# Patient Record
Sex: Male | Born: 1975 | Race: White | Hispanic: No | Marital: Married | Smoking: Never smoker
Health system: Southern US, Community
[De-identification: ages and names within clinical notes are randomized; demographics above are authoritative.]

## PROBLEM LIST (undated history)

## (undated) HISTORY — PX: KNEE SURGERY: SHX244

---

## 2012-01-06 ENCOUNTER — Ambulatory Visit: Payer: Self-pay | Admitting: Family Medicine

## 2012-02-03 ENCOUNTER — Ambulatory Visit: Payer: Self-pay | Admitting: Orthopedic Surgery

## 2015-06-14 ENCOUNTER — Ambulatory Visit
Admission: EM | Admit: 2015-06-14 | Discharge: 2015-06-14 | Disposition: A | Discharge status: Home / Self Care | Payer: BLUE CROSS/BLUE SHIELD | Attending: Internal Medicine | Admitting: Internal Medicine

## 2015-06-14 ENCOUNTER — Encounter: Payer: Self-pay | Admitting: Emergency Medicine

## 2015-06-14 DIAGNOSIS — R059 Cough, unspecified: Secondary | ICD-10-CM

## 2015-06-14 DIAGNOSIS — R05 Cough: Secondary | ICD-10-CM | POA: Diagnosis not present

## 2015-06-14 DIAGNOSIS — J302 Other seasonal allergic rhinitis: Secondary | ICD-10-CM

## 2015-06-14 MED ORDER — KETOROLAC TROMETHAMINE 60 MG/2ML IM SOLN
60.0000 mg | Freq: Once | INTRAMUSCULAR | Status: AC
  Administered 2015-06-14: 60 mg via INTRAMUSCULAR

## 2015-06-14 MED ORDER — IPRATROPIUM-ALBUTEROL 0.5-2.5 (3) MG/3ML IN SOLN
3.0000 mL | Freq: Once | RESPIRATORY_TRACT | Status: AC
  Administered 2015-06-14: 3 mL via RESPIRATORY_TRACT

## 2015-06-14 MED ORDER — ALBUTEROL SULFATE HFA 108 (90 BASE) MCG/ACT IN AERS: 1.0000 | INHALATION_SPRAY | Freq: Four times a day (QID) | RESPIRATORY_TRACT | Status: AC | PRN

## 2015-06-14 MED ORDER — BENZONATATE 100 MG PO CAPS: 100.0000 mg | ORAL_CAPSULE | Freq: Three times a day (TID) | ORAL | Status: AC | PRN

## 2015-06-14 NOTE — Discharge Instructions (Signed)
Zyrtec 1 a day for a month and see how you feel, if improved continue until hard freeze. Fluticasone nasal spray 1 spray each nostril each morning-- repeat in evening for next 3-4 as as needed  Robitussin DM 1 tsp evey 4 hours to loosen mucous Increase fluids by mouth, add steamy showers, vaporizer if available  Inhaler may be used for cough episodes  Benzonatate to turn cough off    Barotitis Media Barotitis media is inflammation of your middle ear. This occurs when the auditory tube (eustachian tube) leading from the back of your nose (nasopharynx) to your eardrum is blocked. This blockage may result from a cold, environmental allergies, or an upper respiratory infection. Unresolved barotitis media may lead to damage or hearing loss (barotrauma), which may become permanent. HOME CARE INSTRUCTIONS   Use medicines as recommended by your health care provider. Over-the-counter medicines will help unblock the canal and can help during times of air travel.  Do not put anything into your ears to clean or unplug them. Eardrops will not be helpful.  Do not swim, dive, or fly until your health care provider says it is all right to do so. If these activities are necessary, chewing gum with frequent, forceful swallowing may help. It is also helpful to hold your nose and gently blow to pop your ears for equalizing pressure changes. This forces air into the eustachian tube.  Only take over-the-counter or prescription medicines for pain, discomfort, or fever as directed by your health care provider.  A decongestant may be helpful in decongesting the middle ear and make pressure equalization easier. SEEK MEDICAL CARE IF:  You experience a serious form of dizziness in which you feel as if the room is spinning and you feel nauseated (vertigo).  Your symptoms only involve one ear. SEEK IMMEDIATE MEDICAL CARE IF:   You develop a severe headache, dizziness, or severe ear pain.  You have bloody or  pus-like drainage from your ears.  You develop a fever.  Your problems do not improve or become worse. MAKE SURE YOU:   Understand these instructions.  Will watch your condition.  Will get help right away if you are not doing well or get worse. Document Released: 10/22/2000 Document Revised: 08/15/2013 Document Reviewed: 05/22/2013 [facility] Patient Information 2015 [place], [place]. This information is not intended to replace advice given to you by your health care provider. Make sure you discuss any questions you have with your health care provider. Allergic Rhinitis Allergic rhinitis is when the mucous membranes in the nose respond to allergens. Allergens are particles in the air that cause your body to have an allergic reaction. This causes you to release allergic antibodies. Through a chain of events, these eventually cause you to release histamine into the blood stream. Although meant to protect the body, it is this release of histamine that causes your discomfort, such as frequent sneezing, congestion, and an itchy, runny nose.  CAUSES  Seasonal allergic rhinitis (hay fever) is caused by pollen allergens that may come from grasses, trees, and weeds. Year-round allergic rhinitis (perennial allergic rhinitis) is caused by allergens such as house dust mites, pet dander, and mold spores.  SYMPTOMS   Nasal stuffiness (congestion).  Itchy, runny nose with sneezing and tearing of the eyes. DIAGNOSIS  Your health care provider can help you determine the allergen or allergens that trigger your symptoms. If you and your health care provider are unable to determine the allergen, skin or blood testing may be used. TREATMENT  Allergic rhinitis does not have a cure, but it can be controlled by:  Medicines and allergy shots (immunotherapy).  Avoiding the allergen. Hay fever may often be treated with antihistamines in pill or nasal spray forms. Antihistamines block the effects of histamine. There  are over-the-counter medicines that may help with nasal congestion and swelling around the eyes. Check with your health care provider before taking or giving this medicine.  If avoiding the allergen or the medicine prescribed do not work, there are many new medicines your health care provider can prescribe. Stronger medicine may be used if initial measures are ineffective. Desensitizing injections can be used if medicine and avoidance does not work. Desensitization is when a patient is given ongoing shots until the body becomes less sensitive to the allergen. Make sure you follow up with your health care provider if problems continue. HOME CARE INSTRUCTIONS It is not possible to completely avoid allergens, but you can reduce your symptoms by taking steps to limit your exposure to them. It helps to know exactly what you are allergic to so that you can avoid your specific triggers. SEEK MEDICAL CARE IF:   You have a fever.  You develop a cough that does not stop easily (persistent).  You have shortness of breath.  You start wheezing.  Symptoms interfere with normal daily activities. Document Released: 07/20/2001 Document Revised: 10/30/2013 Document Reviewed: 07/02/2013 [facility] Patient Information 2015 [place], [place]. This information is not intended to replace advice given to you by your health care provider. Make sure you discuss any questions you have with your health care provider.

## 2015-06-14 NOTE — ED Notes (Signed)
Patient presents here with c/o chest heaviness with cough and congestion since Thursday associated with fever

## 2015-06-14 NOTE — ED Provider Notes (Signed)
CSN: [id number]     Arrival date & time 06/14/15  1015 History   First MD Initiated Contact with Patient 06/14/15 1144     Chief Complaint  Patient presents with  . Cough  . Chest Pain   (Consider location/radiation/quality/duration/timing/severity/associated sxs/prior Treatment) HPI Just 39 years old , complaining  of cough and congestion x 2 days. No fever. Works outside with bucket truck. Travels back and forth from [place]. Lives in [place] Has long history of what sound like eustachian tube dysfunction ( ears pop and squeak "all the time"; has itchy ears and post nasal drip, increased when supine in evening.  No hx seasonal allergies per patient. Now with repetitive non-productive cough,frequent throat clearing, malaise,headache, feels "rough", mild fever, treating with occasional ibuprofen-inadequate. All symptoms have increased over past week or so, cough annoying  History reviewed. No pertinent past medical history. Past Surgical History  Procedure Laterality Date  . Knee surgery     History reviewed. No pertinent family history. History  Substance Use Topics  . Smoking status: Never Smoker   . Smokeless tobacco: Not on file  . Alcohol Use: No    Review of Systems Constitutional -afebrile Eyes-denies visual changes ENT- normal voice,repots scratchy throat and post nasal drip CV-denies chest pain Resp-denies SOB GI- negative for nausea,vomiting, diarrhea GU- negative for dysuria MSK- negative for back pain, ambulatory Skin- denies acute changes Neuro- negative,focal weakness or numbness; positive headache    Allergies  Review of patient's allergies indicates no known allergies.  Home Medications   Prior to Admission medications   Medication Sig Start Date End Date Taking? Authorizing Provider  albuterol (PROVENTIL HFA;VENTOLIN HFA) 108 (90 BASE) MCG/ACT inhaler Inhale 1-2 puffs into the lungs every 6 (six) hours as needed for wheezing or shortness of breath.  06/14/15   Rae Halsted, PA-C  benzonatate (TESSALON) 100 MG capsule Take 1 capsule (100 mg total) by mouth 3 (three) times daily as needed for cough. 06/14/15   Rae Halsted, PA-C   BP 124/82 mmHg  Pulse 93  Temp(Src) 98.7 F (37.1 C) (Oral)  Resp 20  Ht 6' (1.829 m)  Wt 245 lb (111.131 kg)  BMI 33.22 kg/m2  SpO2 99% Physical Exam   Constitutional -alert and oriented,well appearing and in no acute distress Head-atraumatic, normocephalic Eyes- conjunctiva normal, EOMI ,conjugate gaze Nose- no congestion or rhinorrhea Mouth/throat- mucous membranes moist ,oropharynx non-erythematous Neck- supple without glandular enlargement CV- regular rate, grossly normal heart sounds,  Resp-no distress, normal respiratory effort,clear to auscultation bilaterally GI- soft,non-tender,no distention GU- unremarkable/ not examined MSK- no tender, normal ROM, all extremities, ambulatory, self-care Neuro- normal speech and language, no gross focal neurological deficit appreciated, no gait instability, Skin-warm,dry ,intact; no rash noted Psych-mood and affect grossly normal; speech and behavior grossly normal ED Course  Procedures (including critical care time) Labs Review Labs Reviewed - No data to display  Imaging Review No results found.  Medications  ipratropium-albuterol (DUONEB) 0.5-2.5 (3) MG/3ML nebulizer solution 3 mL (3 mLs Nebulization Given 06/14/15 1202)  ketorolac (TORADOL) injection 60 mg (60 mg Intramuscular Given 06/14/15 1203)   DuoNeb therapy made his chest feel open-he now remembers having an inhaler in previous years-had forgotten about it Toradol removed headache and body discomfort. Ready to face [age] ! MDM   1. Cough   2. Seasonal allergies    Diagnosis and treatment discussed. . Questions fielded, expectations and recommendations reviewed- start ceterizine and Flonase for a 30 day trial, if pleased continue  until hard freeze. Inhaler and Benzonatate as needed.  Patient expresses understanding. Will return to [facility] with questions, concern or exacerbation.  Marland Kitchen Discharge Medication List as of 06/14/2015 12:18 PM    START taking these medications   Details  albuterol (PROVENTIL HFA;VENTOLIN HFA) 108 (90 BASE) MCG/ACT inhaler Inhale 1-2 puffs into the lungs every 6 (six) hours as needed for wheezing or shortness of breath., Starting 06/14/2015, Until Discontinued, Print    benzonatate (TESSALON) 100 MG capsule Take 1 capsule (100 mg total) by mouth 3 (three) times daily as needed for cough., Starting 06/14/2015, Until Discontinued, Print          Rae Halsted, PA-C 06/16/15 [phone]

## 2015-06-16 ENCOUNTER — Encounter: Payer: Self-pay | Admitting: Physician Assistant

## 2021-12-10 ENCOUNTER — Other Ambulatory Visit: Payer: Self-pay | Admitting: Family Medicine

## 2021-12-10 DIAGNOSIS — N5089 Other specified disorders of the male genital organs: Secondary | ICD-10-CM

## 2021-12-11 ENCOUNTER — Other Ambulatory Visit: Payer: Self-pay

## 2021-12-11 ENCOUNTER — Ambulatory Visit
Admission: RE | Admit: 2021-12-11 | Discharge: 2021-12-11 | Disposition: A | Discharge status: Home / Self Care | Payer: No Typology Code available for payment source | Source: Ambulatory Visit | Attending: Family Medicine | Admitting: Family Medicine

## 2021-12-11 DIAGNOSIS — N5089 Other specified disorders of the male genital organs: Secondary | ICD-10-CM | POA: Insufficient documentation

## 2021-12-22 ENCOUNTER — Ambulatory Visit: Payer: Self-pay | Admitting: Urology

## 2023-08-23 IMAGING — US US SCROTUM W/ DOPPLER COMPLETE
1 series · 13 of 25 positions shown · non-contrast
Comparison: None.

CLINICAL DATA: Left testicular enlarged

EXAM:
SCROTAL ULTRASOUND
DOPPLER ULTRASOUND OF THE TESTICLES
TECHNIQUE: Complete ultrasound examination of the testicles, epididymis, and
other scrotal structures was performed. Color and spectral Doppler
ultrasound were also utilized to evaluate blood flow to the
testicles.

[Series 1: us scrotum w/ doppler complete · 0.10mm/px · 13 of 75 slices shown]
[im 1/75]
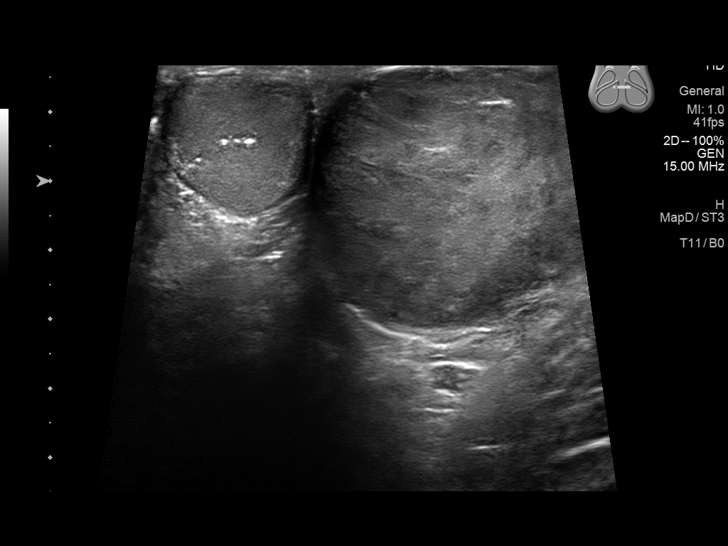
[im 7/75]
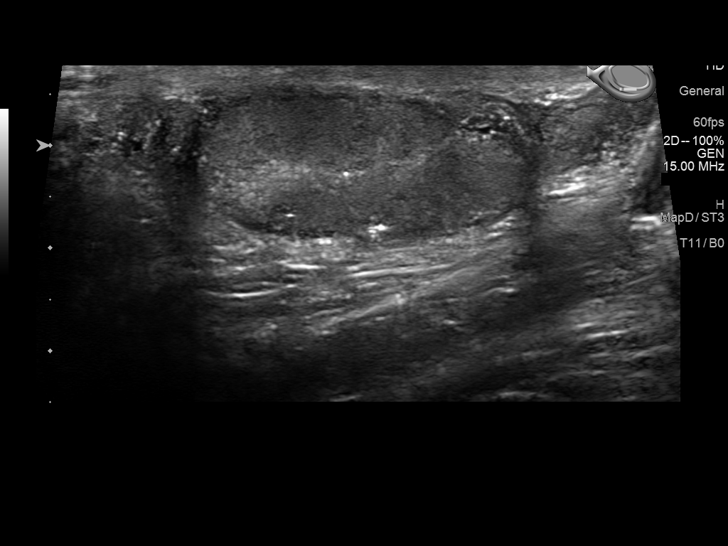
[im 13/75]
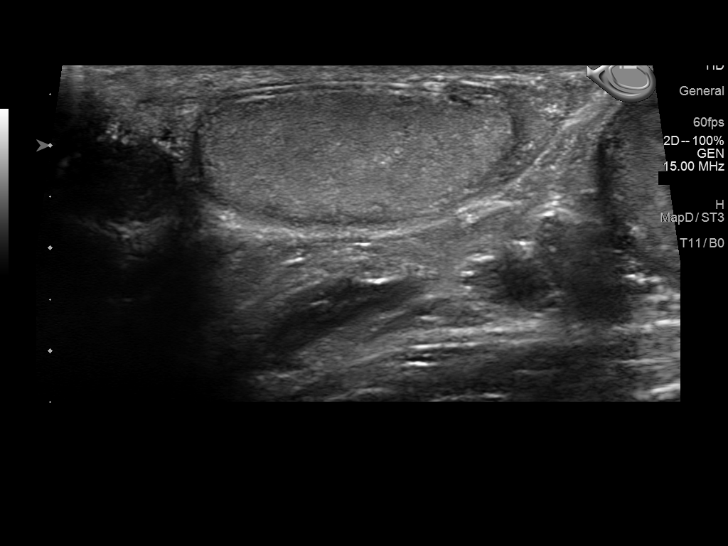
[im 19/75]
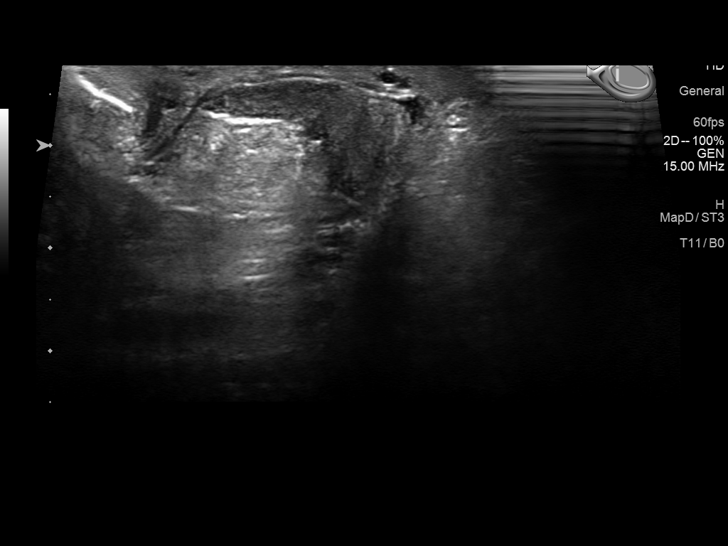
[im 25/75]
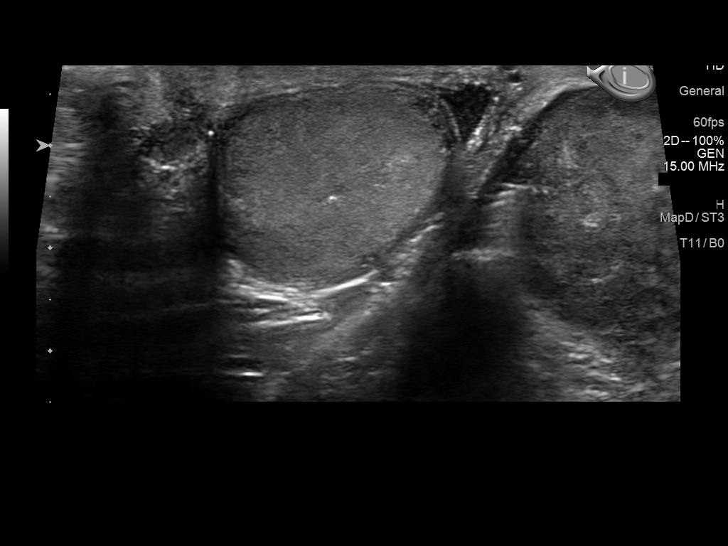
[im 31/75]
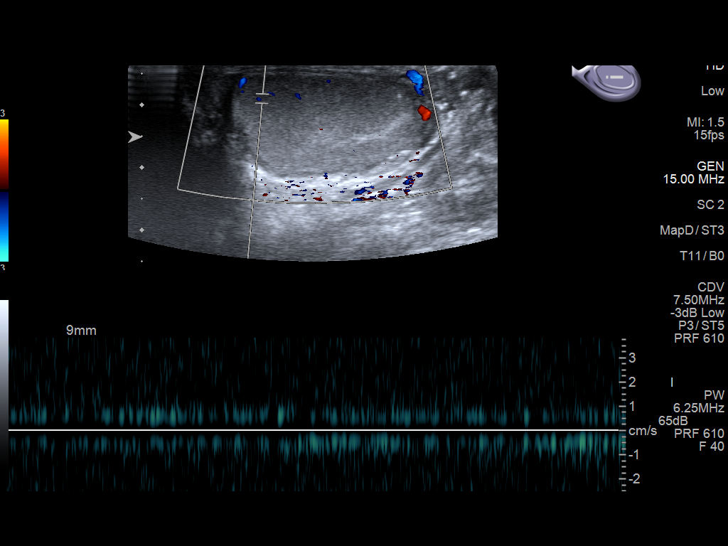
[im 38/75]
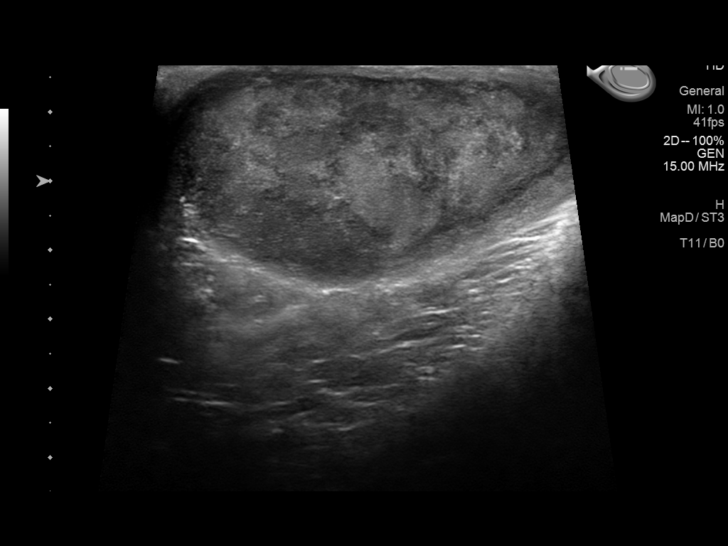
[im 44/75]
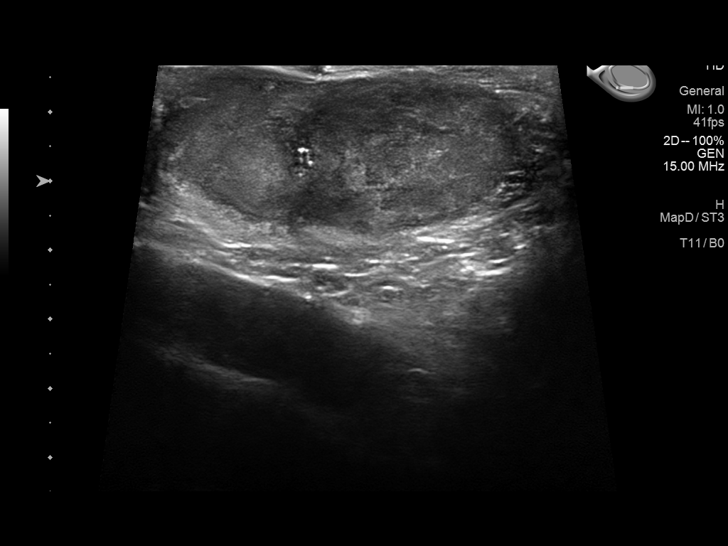
[im 50/75]
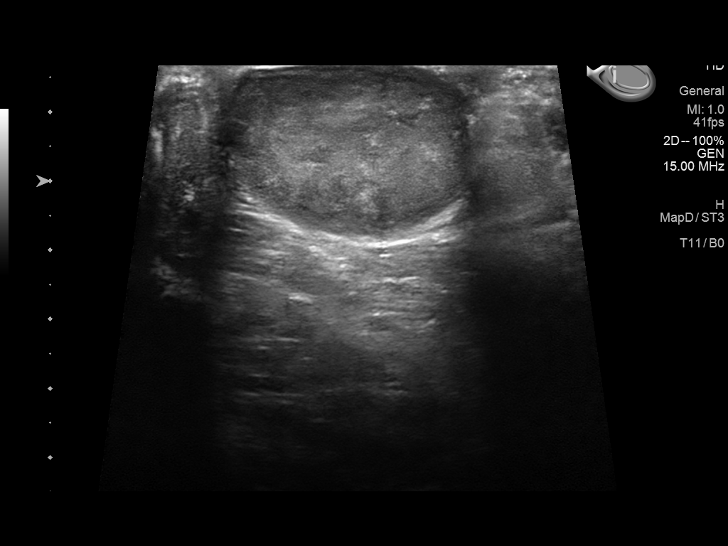
[im 56/75]
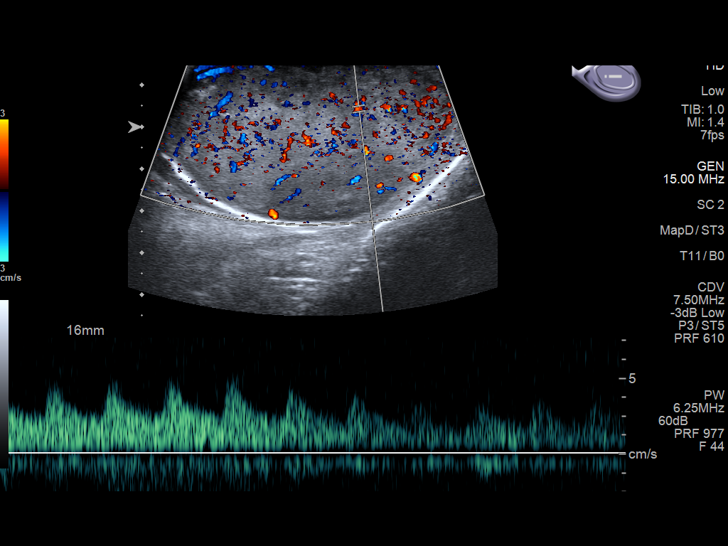
[im 62/75]
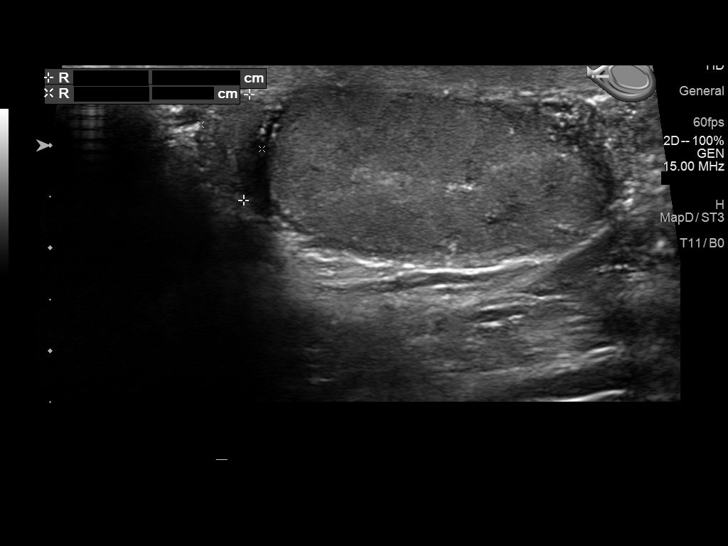
[im 68/75]
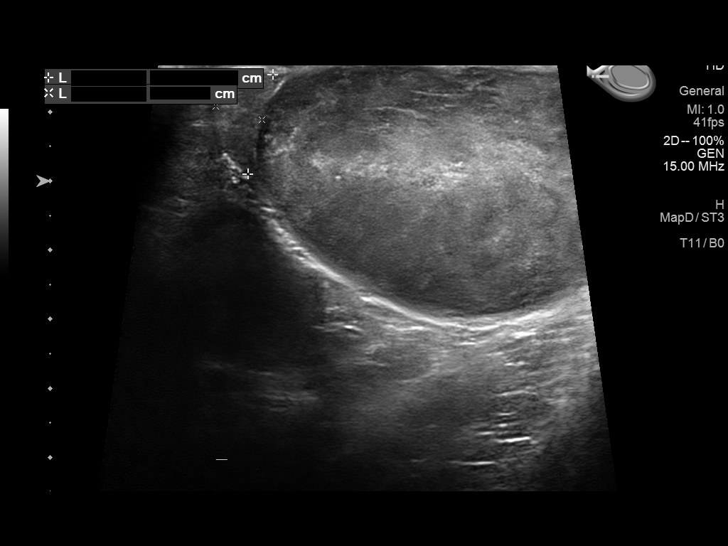
[im 75/75]
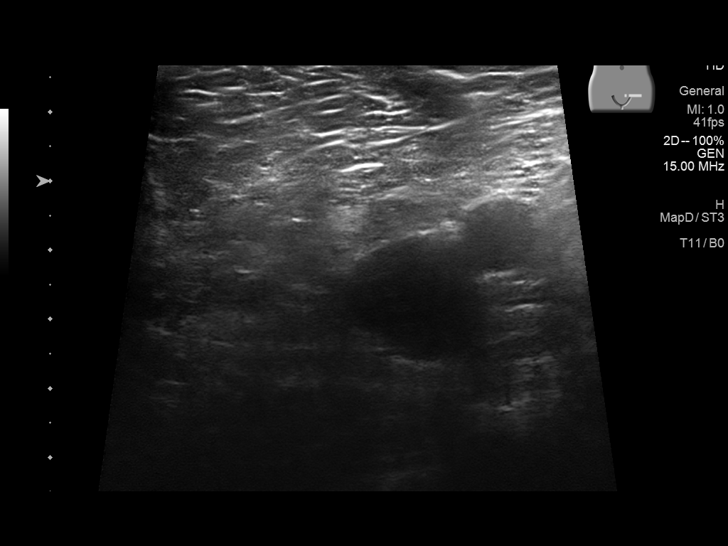

[13 of 25 positions shown; findings below may reference images not displayed]

FINDINGS: Right testicle

Measurements: 3.3 x 1.9 x 2.5 cm. Scattered microcalcifications are
noted. No mass lesion is seen.

Left testicle

Measurements: 6.9 x 3.6 x 5.0 cm. Normal vascularity is noted. The
testicle however is diffusely enlarged compared to the right and
heterogeneous. No discrete mass is noted.

Right epididymis:  Normal in size and appearance.

Left epididymis:  Normal in size and appearance.

Hydrocele:  Small bilateral hydroceles are noted.

Varicocele:  None visualized.

Pulsed Doppler interrogation of both testes demonstrates normal low
resistance arterial and venous waveforms bilaterally.
IMPRESSION: Diffuse enlargement of the left testicle with heterogeneity
identified. No discrete mass is seen. Diffuse neoplastic involvement
would deserve consideration and urologic consultation is
recommended.

These results will be called to the ordering clinician or
representative by the Radiologist Assistant, and communication
documented in the PACS or [REDACTED].

## 2024-10-15 ENCOUNTER — Other Ambulatory Visit: Payer: Self-pay | Admitting: Gastroenterology

## 2024-10-15 DIAGNOSIS — K219 Gastro-esophageal reflux disease without esophagitis: Secondary | ICD-10-CM

## 2024-10-23 ENCOUNTER — Ambulatory Visit: Admission: RE | Admit: 2024-10-23 | Discharge: 2024-10-23 | Attending: Gastroenterology | Admitting: Gastroenterology

## 2024-10-23 DIAGNOSIS — K219 Gastro-esophageal reflux disease without esophagitis: Secondary | ICD-10-CM | POA: Insufficient documentation
